# Patient Record
Sex: Male | Born: 1950 | Race: White | Hispanic: No | Marital: Married | Smoking: Never smoker
Health system: Southern US, Community
[De-identification: ages and names within clinical notes are randomized; demographics above are authoritative.]

## PROBLEM LIST (undated history)

## (undated) DIAGNOSIS — I219 Acute myocardial infarction, unspecified: Secondary | ICD-10-CM

## (undated) DIAGNOSIS — E785 Hyperlipidemia, unspecified: Secondary | ICD-10-CM

## (undated) DIAGNOSIS — I2510 Atherosclerotic heart disease of native coronary artery without angina pectoris: Secondary | ICD-10-CM

## (undated) DIAGNOSIS — K635 Polyp of colon: Secondary | ICD-10-CM

## (undated) DIAGNOSIS — I10 Essential (primary) hypertension: Secondary | ICD-10-CM

## (undated) HISTORY — PX: CORONARY ARTERY BYPASS GRAFT: SHX141

## (undated) HISTORY — PX: COLONOSCOPY: SHX174

---

## 2004-12-15 ENCOUNTER — Ambulatory Visit: Payer: Self-pay | Admitting: Gastroenterology

## 2010-01-10 ENCOUNTER — Ambulatory Visit: Payer: Self-pay | Admitting: Gastroenterology

## 2011-10-03 ENCOUNTER — Inpatient Hospital Stay: Payer: Self-pay | Admitting: Internal Medicine

## 2011-10-29 ENCOUNTER — Encounter: Payer: Self-pay | Admitting: Internal Medicine

## 2011-11-09 ENCOUNTER — Encounter: Payer: Self-pay | Admitting: Internal Medicine

## 2011-12-07 ENCOUNTER — Encounter: Payer: Self-pay | Admitting: Internal Medicine

## 2012-12-29 IMAGING — CR DG CHEST 2V
1 series · 2 of 2 positions shown · non-contrast
Comparison: none

REASON FOR EXAM: pain
COMMENTS:   May transport without cardiac monitor

[Series 1: pa · 0.17mm/px · 2 of 2 slices shown]
[im 1/2]
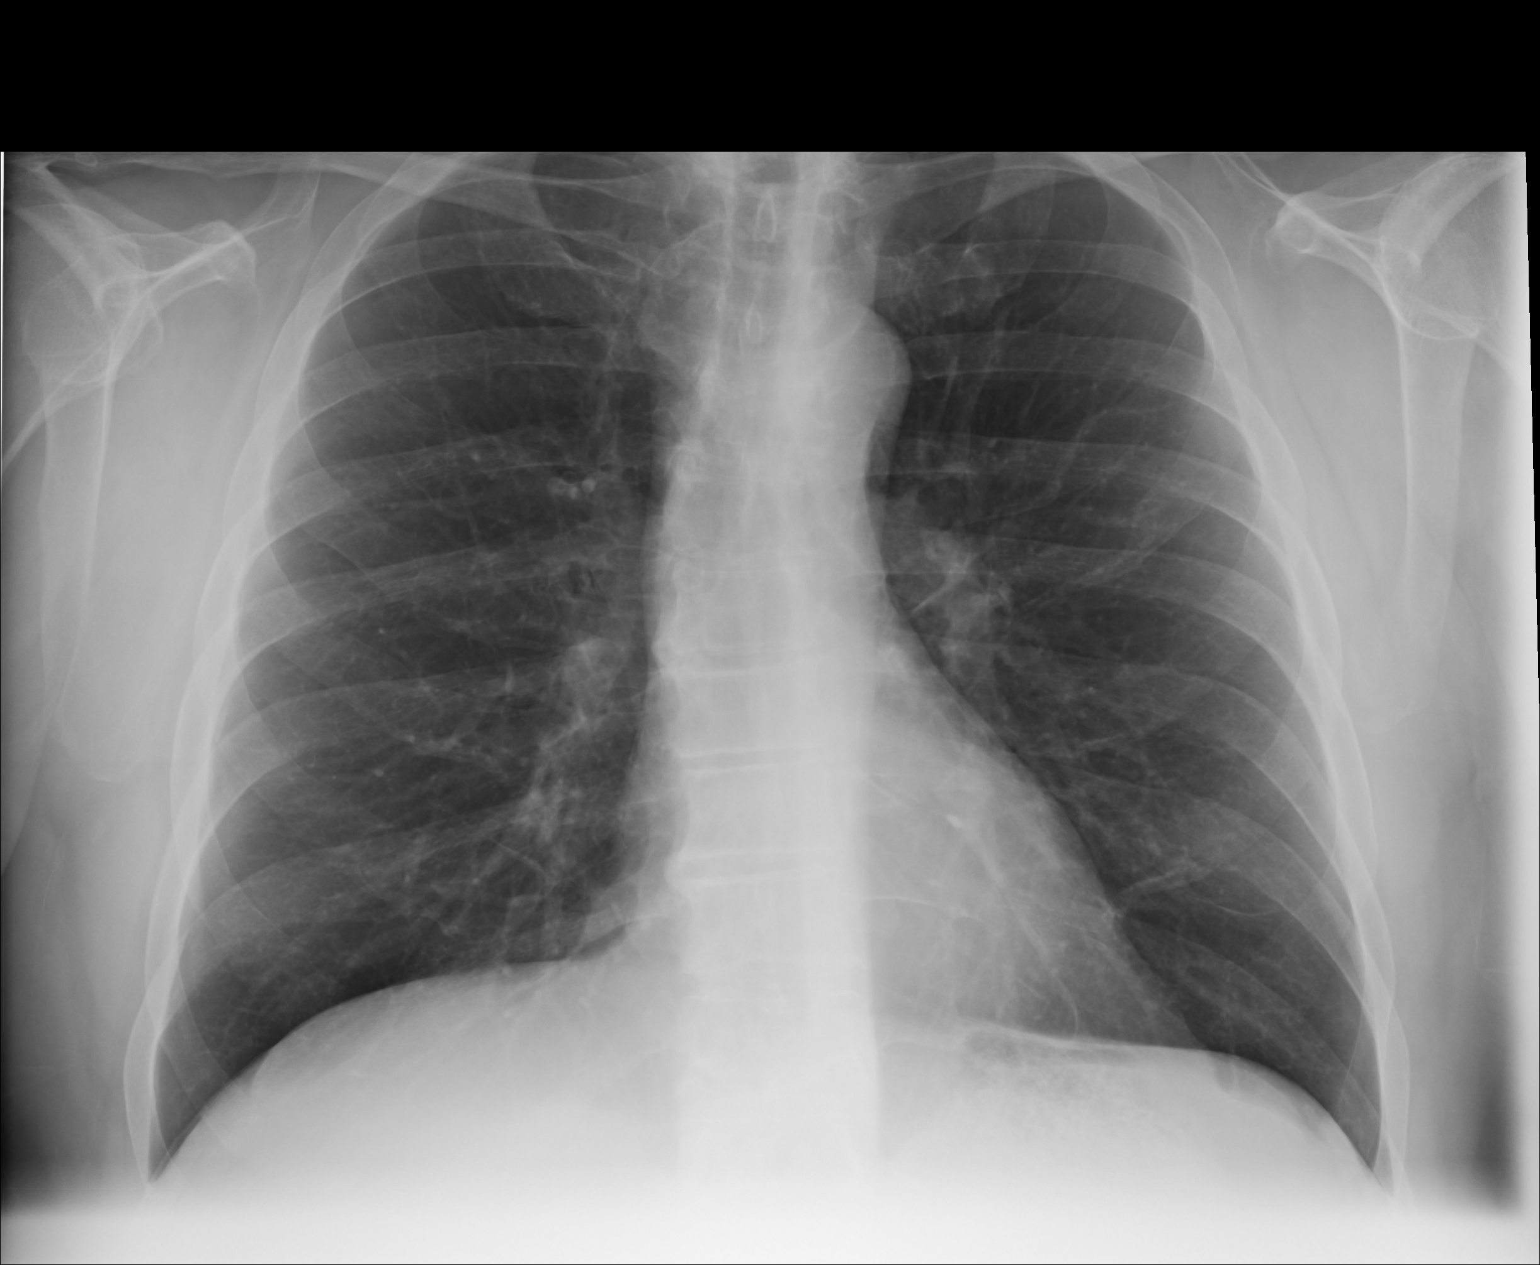
[im 2/2]
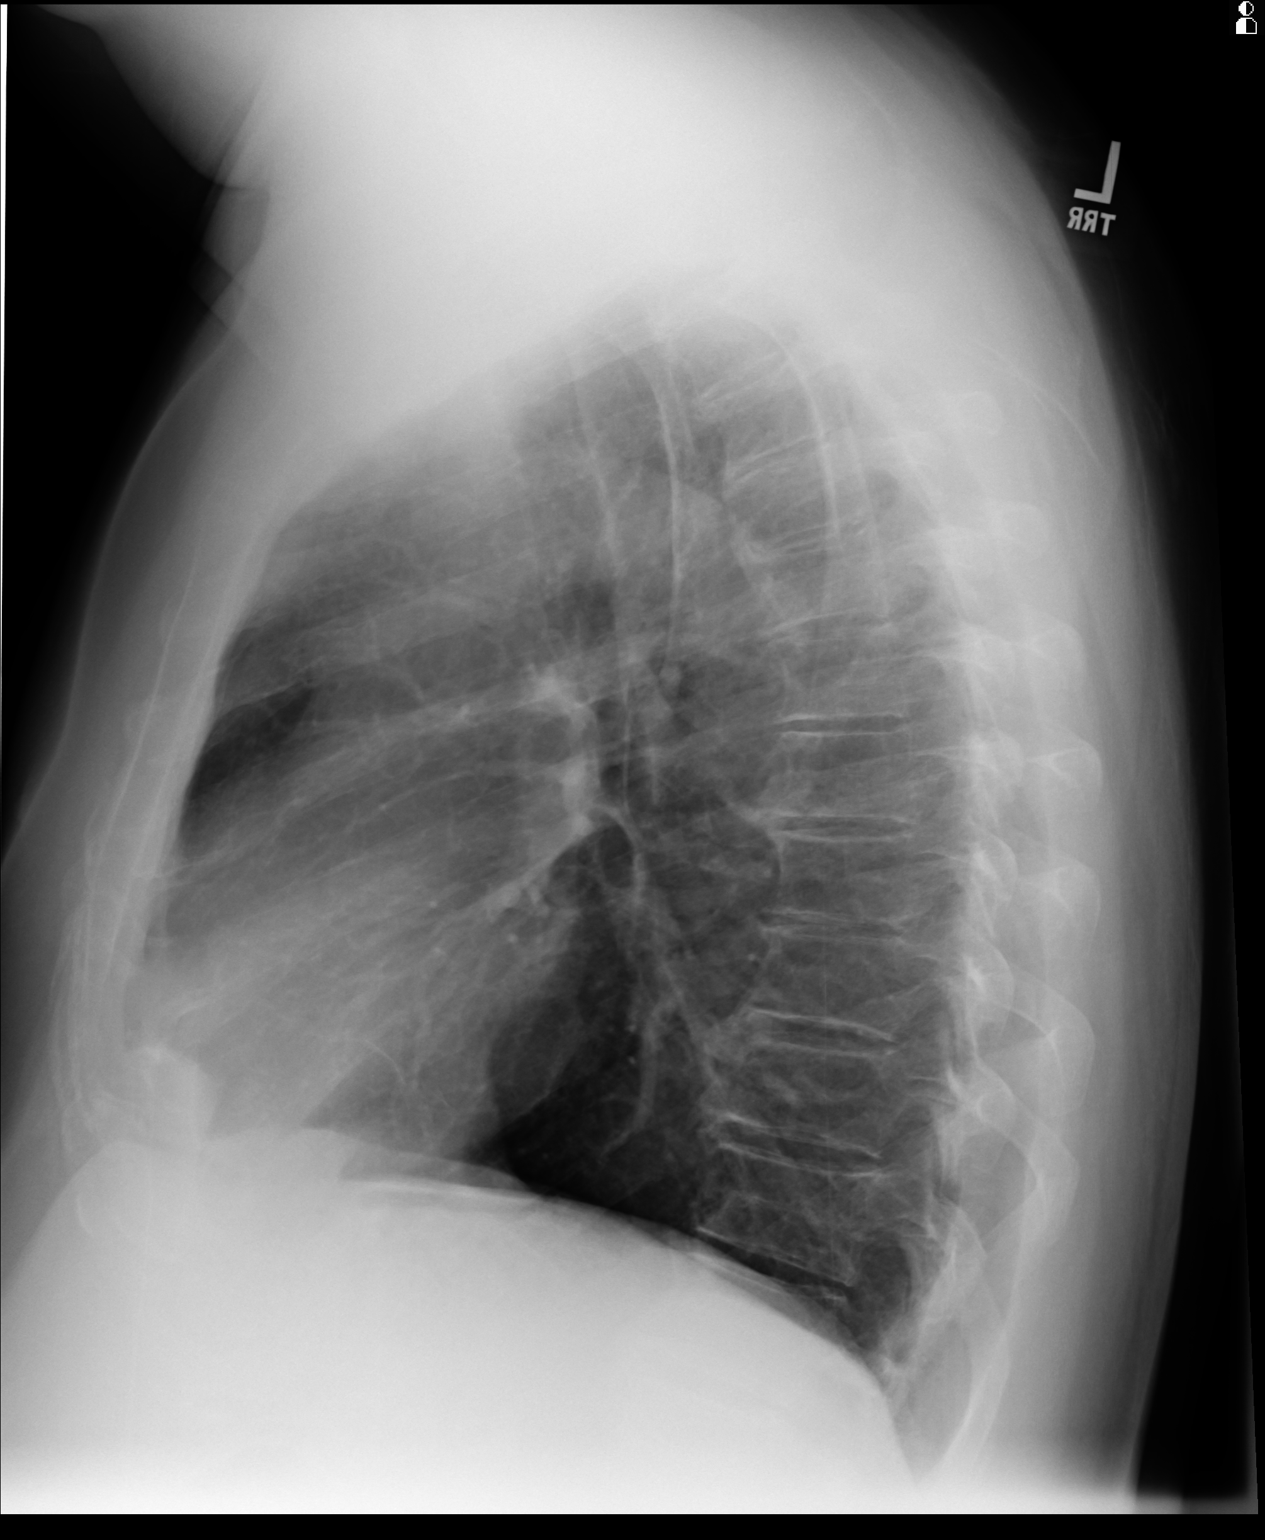

[2 of 2 positions shown; findings below may reference images not displayed]

PROCEDURE:     DXR - DXR CHEST PA (OR AP) AND LATERAL  - October 03, 2011  [DATE]

RESULT:     There is mild hyperinflation. The lungs are clear. The heart and
pulmonary vessels are normal. The bony and mediastinal structures are
unremarkable. There is no effusion. There is no pneumothorax or evidence of
congestive failure.
IMPRESSION: No acute cardiopulmonary disease.

## 2015-01-30 NOTE — Discharge Summary (Signed)
PATIENT NAME:  Kenneth Davenport, Kenneth Davenport MR#:  [id number] DATE OF BIRTH:  1951-09-23  DATE OF ADMISSION:  10/03/2011 DATE OF DISCHARGE:  10/04/2011  DISPOSITION: The patient is getting [facility] artery bypass graft.  DISCHARGE DIAGNOSES:  1. 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 The patient is getting transferred for coronary artery bypass graft.  2. Hyperlipidemia.  3. Tobacco abuse.   CONSULTANT: Arnoldo HookerBruce Kowalski, MD  PROCEDURES: Cardiac catheterization.   HOSPITAL COURSE: This is a 64 year old male who smokes cigars with history of hyperlipidemia. He presented with chest pain across the center of the chest radiating to his left arm improved with nitroglycerin in the emergency room. His EKG shows that he had some ST changes in the anterolateral leads, it was sinus rhythm. When he came his troponins were positive at 0.98 with a CK of 173 and MB of 9.6. He was admitted as acute non-ST elevation myocardial infarction. He was started on heparin drip, aspirin, beta blocker, nitroglycerin and statin. His creatinine was normal at 1.02. His peak troponins have gone up to 7.8 today. His lipid profile shows that his triglycerides are 236 and LDL is 77. His hemoglobin A1c is 6.5. He was taken to cardiac catheterization yesterday, on 10/03/2011, and it showed that the patient has significant three vessel disease with proximal LAD 60% stenosis, second lesion 80% stenosis, first diagonal 50% stenosis, OM-1 85% stenosis, OM-2 95% stenosis, proximal RCA 70% stenosis, second lesion 60% stenosis, and distal RCA 90% stenosis. The patient is chest pain free right now. His hemoglobin has been stable at 14.3 with a normal platelet of 208,000. His creatinine is stable at 0.95. He is currently on aspirin, Lovenox, metoprolol, and a statin. He also got IV hydration, peri-catheterization.      DISCHARGE MEDICATIONS: 1. Norco 5/325 mg 1 to 2 tabs every four hours as needed.  2. Aspirin 81 mg daily.  3. Lipitor 20 mg p.o. once daily. 4. Lovenox 105 mg subcutaneous twice a day. 5. Morphine 1 to 2 mg IV every four hours p.r.n.  6. Nitro-Dur 0.3 mg topical daily.  7. Zofran p.r.n.  8. Protonix 40 mg daily. 9. Ambien 10 mg at bedtime.   DISCHARGE CONDITION: His current vitals include a T-max around 99, heart rate 85, blood pressure 134/80, and saturating 95% on room air. His chest is clear. Heart sounds are regular.   DISPOSITION: The transfer process is initiated by Dr. Gwen PoundsKowalski, his cardiologist, and he is getting [facility] for coronary artery bypass graft.  ____________________________ Kenneth SorrowAbhinav Samuel Mcpeek, MD ag:slb D: 10/04/2011 16:29:23 ET T: 10/04/2011 16:41:43 ET JOB#: [id number]  cc: Kenneth SorrowAbhinav Yonas Bunda, MD, <Dictator> Jorje GuildGlenn R. Beckey DowningWillett, MD Kenneth SorrowABHINAV Consuelo Suthers MD ELECTRONICALLY SIGNED 10/26/2011 12:11

## 2015-01-30 NOTE — Consult Note (Signed)
PATIENT NAME:  Kenneth Davenport, Kenneth Davenport MR#:  [id number] DATE OF BIRTH:  June 29, 1951  DATE OF CONSULTATION:  10/03/2011  REFERRING PHYSICIAN:  Alounthith Phichith, MD CONSULTING PHYSICIAN:  Lamar BlinksBruce J. Alyzah Pelly, MD  REASON FOR CONSULTATION: Non ST elevation myocardial infarction.   CHIEF COMPLAINT: "I have chest pain."   HISTORY OF PRESENT ILLNESS: This is a 64 year old male with hypertension and hyperlipidemia having substernal chest discomfort intermittently over the last several weeks, worsening on the day of admission with radiation to his back and shortness of breath. The patient has had known appropriate treatment of hypertension and hyperlipidemia with current medical regimen. He has now an EKG showing normal sinus rhythm with inferior EKG changes consistent with infarct and possible myocardial ischemia. Troponin is 3. The patient has full improvements of symptoms and no longer has had any chest discomfort with appropriate medications including heparin.   REVIEW OF SYSTEMS:  The remainder of review of systems negative for vision change, ringing in ears, hearing loss, cough, congestion, heartburn, nausea, vomiting, diarrhea, bloody stool, stomach pain, extremity pain, leg weakness, cramping of the buttocks, known blood clots, headaches, blackouts, dizzy spells, nosebleeds, congestion, trouble swallowing, frequent urination, urination at night, muscle weakness, numbness, anxiety, depression, skin lesions, or skin rashes.   PAST MEDICAL HISTORY:  1. Hypertension.  2. Hyperlipidemia.   FAMILY HISTORY: Multiple family members with early onset of cardiovascular disease and hypertension.   SOCIAL HISTORY: He smokes 1 pack per day and occasionally drinks alcohol.   ALLERGIES: He has no known drug allergies.   CURRENT MEDICATIONS: As listed.   PHYSICAL EXAMINATION:  VITAL SIGNS: Blood pressure 136/68 bilaterally, heart 72 upright, reclining, and regular.   GENERAL: He is a well-appearing male in no  acute distress.   HEENT: No icterus, thyromegaly, ulcers, hemorrhage, or xanthelasma.   HEART: Regular rate and rhythm. Normal S1 and S2 without murmur, gallop, or rub. Point of maximal impulse is normal size and placement. Carotid upstroke normal without bruit. Jugular venous pressure is normal.   LUNGS: Lungs have few basilar crackles with normal respirations.   ABDOMEN: Soft, nontender, without hepatosplenomegaly or masses. Abdominal aorta is normal size without bruit.   EXTREMITIES: 2+ bilateral pulses in dorsal, pedal, radial, and femoral arteries without lower extremity edema, cyanosis, clubbing, or ulcers.   NEUROLOGIC: He is oriented to time, place, and person with normal mood and affect.   ASSESSMENT: 64 year old male with known cardiovascular disease risk factors including hypertension and hyperlipidemia having an abnormal EKG elevation of troponin and chest pain consistent with subendocardial myocardial infarction.   RECOMMENDATIONS:  1. Continue heparin, nitroglycerin, oxygen and beta blocker for further risk reduction. 2. Proceed to cardiac catheterization to assess coronary anatomy and further treatment thereof as necessary. The patient understands the risks and benefits of cardiac catheterization. These include the possibly of death, stroke, heart attack, infection, bleeding, or blood clot. He is at low risk for conscious sedation.   ____________________________ Lamar BlinksBruce J. Bobbye Reinitz, MD bjk:ap D: 10/04/2011 07:45:37 ET            T: 10/04/2011 10:16:48 ET                JOB#: [id number] cc: Lamar BlinksBruce J. Nike Southwell, MD, <Dictator> Lamar BlinksBRUCE J Marene Gilliam MD ELECTRONICALLY SIGNED 10/15/2011 9:46

## 2015-01-30 NOTE — H&P (Signed)
PATIENT NAME:  Kenneth Davenport, Kenneth Davenport MR#:  [id number] DATE OF BIRTH:  08-01-1951  DATE OF ADMISSION:  10/03/2011  REFERRING PHYSICIAN: Dr. Marilynne Halsted  PRIMARY CARE PHYSICIAN: Dr. Beckey Downing  PRESENTING COMPLAINT: Chest pain.   HISTORY OF PRESENT ILLNESS: Kenneth Davenport is a pleasant 64 year old gentleman with history of hyperlipidemia, arthritis who presents today with complaints of developing chest pain across the center of his chest while he was in bed. No associated nausea, vomiting, shortness of breath, diaphoresis, presyncope. He endorses palpitations. No radiation of pain. Reports that nitroglycerin has improved his symptoms but he is still currently feeling some pressure. No prior episodes in the past.   PAST MEDICAL HISTORY:  1. Hyperlipidemia.  2. Arthritis.   PAST SURGICAL HISTORY: None.   ALLERGIES: None.   MEDICATIONS:  1. Lipitor 10 mg daily.  2. Aleve as needed.   FAMILY HISTORY: Mother with bypass at the age of 32. Brother had coronary disease and stent the age of 60. Another brother at 40 with one vessel bypass. Paternal grandparents both died around 33 years old of myocardial infarction. Father had diabetes and colon cancer, is deceased from colon cancer. Paternal uncle had diabetes.   SOCIAL HISTORY: He lives in [place] with his wife. He smokes 1 to 2 cigars daily. Has 2 to 3 beers per day. No drug use.   REVIEW OF SYSTEMS: CONSTITUTIONAL: No fevers, chills. EYES: No inflammation, glaucoma, cataracts. ENT: No ear pain, hearing loss, epistaxis. RESPIRATORY: No cough, wheezing, hemoptysis. CARDIOVASCULAR: As per history of present illness. GASTROINTESTINAL: No nausea, vomiting, diarrhea, abdominal pain, hematemesis. GENITOURINARY: No dysuria, hematuria. ENDO: No polyuria, polydipsia. HEMATOLOGIC: No easy bleeding. SKIN: No ulcers. MUSCULOSKELETAL: He has occasional bright knee pain from injury 25 years ago. NEUROLOGIC: No one-sided weakness or numbness. PSYCH: Denies any depression or  suicidal ideation.   PHYSICAL EXAMINATION:  VITAL SIGNS: Temperature 98.2, pulse 93, respiratory rate 18, blood pressure 168/100, current blood pressure 158/73, sating at 97% on room air.   GENERAL: Lying in bed in no apparent distress.   HEENT: Normocephalic, atraumatic. Pupils equal, symmetric, nonicteric. Nares without discharge. Moist mucous membrane.   NECK: Soft and supple. No adenopathy or JVP.   CARDIOVASCULAR: Tachycardic. No murmurs, rubs, or gallops.   LUNGS: Clear to auscultation bilaterally. No use of accessory muscles or increased respiratory effort.   ABDOMEN: Soft, nontender, positive bowel sounds. No mass appreciated.   EXTREMITIES: No edema. Dorsal pedis pulses intact.   MUSCULOSKELETAL: No joint effusion.   SKIN: No ulcers.   NEUROLOGIC: No dysarthria or aphasia. Symmetrical strength. No focal deficits.   PSYCH: He is alert and oriented. Patient is cooperative.   LABORATORY, DIAGNOSTIC AND RADIOLOGICAL DATA: WBC 8.3, hemoglobin 17.0, hematocrit 50.1, platelet 214, MCV 85, glucose 184, BUN 16, creatinine 1.02, sodium 143, potassium 4.2, chloride 104, carbon dioxide 26, calcium 9.5, troponin 0.98, PTT 34.1. EKG with normal sinus rate of 95. No ST elevation. He has T wave in lead 3 and 2.   ASSESSMENT AND PLAN: Kenneth Davenport is a pleasant 64 year old gentleman with history of hyperlipidemia, tobacco use, arthritis presenting with chest pain.  1. Non-ST elevation myocardial infarction. Patient with risk factors including age and gender with significant family history, hyperlipidemia, tobacco use. Will start patient on aspirin. Continue heparin drip. Will start nitro patch. Increase Lipitor. Start beta blocker, oxygen, morphine as needed. Will send TSH, fasting lipid panel, A1c. He has hyperglycemia but no prior diagnosis of diabetes. Obtain cardiology consultation, would likely benefit from catheterization.  2. Hypertension with no prior diagnosis. As above initiate on beta  blocker.  3. Cigar use. Start on nicotine patch. Encouraged smoking cessation.  4. Daily alcohol use. No history of withdrawals. Continue to follow.  5. Prophylaxis with heparin drip, aspirin and Protonix.   TIME SPENT: Approximately 45 minutes spent on patient care.   ____________________________ Reuel DerbyAlounthith Deen Deguia, MD ap:cms D: 10/03/2011 03:35:44 ET T: 10/03/2011 09:09:31 ET JOB#: [id number]  cc: Maggi Hershkowitz, MD, <Dictator> Jorje GuildGlenn R. Beckey DowningWillett, MD Reuel DerbyALOUNTHITH Ludell Zacarias MD ELECTRONICALLY SIGNED 10/27/2011 2:20

## 2020-03-15 ENCOUNTER — Other Ambulatory Visit
Admission: RE | Admit: 2020-03-15 | Discharge: 2020-03-15 | Disposition: A | Discharge status: Home / Self Care | Payer: Medicare Other | Source: Ambulatory Visit | Attending: Internal Medicine | Admitting: Internal Medicine

## 2020-03-15 ENCOUNTER — Other Ambulatory Visit: Payer: Self-pay

## 2020-03-15 DIAGNOSIS — Z01812 Encounter for preprocedural laboratory examination: Secondary | ICD-10-CM | POA: Insufficient documentation

## 2020-03-15 DIAGNOSIS — Z20822 Contact with and (suspected) exposure to covid-19: Secondary | ICD-10-CM | POA: Insufficient documentation

## 2020-03-16 ENCOUNTER — Encounter: Payer: Self-pay | Admitting: Internal Medicine

## 2020-03-16 LAB — SARS CORONAVIRUS 2 (TAT 6-24 HRS): SARS Coronavirus 2: NEGATIVE

## 2020-03-17 ENCOUNTER — Ambulatory Visit
Admission: RE | Admit: 2020-03-17 | Discharge: 2020-03-17 | Disposition: A | Discharge status: Home / Self Care | Payer: Medicare Other | Attending: Internal Medicine | Admitting: Internal Medicine

## 2020-03-17 ENCOUNTER — Encounter: Payer: Self-pay | Admitting: Internal Medicine

## 2020-03-17 ENCOUNTER — Ambulatory Visit: Payer: Medicare Other | Admitting: Certified Registered Nurse Anesthetist

## 2020-03-17 ENCOUNTER — Encounter
Admission: RE | Disposition: A | Discharge status: Home / Self Care | Payer: Self-pay | Source: Home / Self Care | Attending: Internal Medicine

## 2020-03-17 DIAGNOSIS — E785 Hyperlipidemia, unspecified: Secondary | ICD-10-CM | POA: Diagnosis not present

## 2020-03-17 DIAGNOSIS — Z7982 Long term (current) use of aspirin: Secondary | ICD-10-CM | POA: Insufficient documentation

## 2020-03-17 DIAGNOSIS — Z1211 Encounter for screening for malignant neoplasm of colon: Secondary | ICD-10-CM | POA: Insufficient documentation

## 2020-03-17 DIAGNOSIS — Z951 Presence of aortocoronary bypass graft: Secondary | ICD-10-CM | POA: Diagnosis not present

## 2020-03-17 DIAGNOSIS — Z79899 Other long term (current) drug therapy: Secondary | ICD-10-CM | POA: Diagnosis not present

## 2020-03-17 DIAGNOSIS — Z800 Family history of malignant neoplasm of digestive organs: Secondary | ICD-10-CM | POA: Diagnosis not present

## 2020-03-17 DIAGNOSIS — I10 Essential (primary) hypertension: Secondary | ICD-10-CM | POA: Insufficient documentation

## 2020-03-17 DIAGNOSIS — I252 Old myocardial infarction: Secondary | ICD-10-CM | POA: Insufficient documentation

## 2020-03-17 DIAGNOSIS — I2510 Atherosclerotic heart disease of native coronary artery without angina pectoris: Secondary | ICD-10-CM | POA: Diagnosis not present

## 2020-03-17 HISTORY — DX: Atherosclerotic heart disease of native coronary artery without angina pectoris: I25.10

## 2020-03-17 HISTORY — PX: COLONOSCOPY WITH PROPOFOL: SHX5780

## 2020-03-17 HISTORY — DX: Polyp of colon: K63.5

## 2020-03-17 HISTORY — DX: Essential (primary) hypertension: I10

## 2020-03-17 HISTORY — DX: Acute myocardial infarction, unspecified: I21.9

## 2020-03-17 HISTORY — DX: Hyperlipidemia, unspecified: E78.5

## 2020-03-17 SURGERY — COLONOSCOPY WITH PROPOFOL
Anesthesia: General

## 2020-03-17 MED ORDER — PROPOFOL 10 MG/ML IV BOLUS
INTRAVENOUS | Status: DC | PRN
  Administered 2020-03-17: 40 mg via INTRAVENOUS
  Administered 2020-03-17: 60 mg via INTRAVENOUS

## 2020-03-17 MED ORDER — SODIUM CHLORIDE 0.9 % IV SOLN: INTRAVENOUS | Status: DC

## 2020-03-17 MED ORDER — PHENYLEPHRINE HCL (PRESSORS) 10 MG/ML IV SOLN
INTRAVENOUS | Status: AC
  Filled 2020-03-17: qty 1

## 2020-03-17 MED ORDER — SODIUM CHLORIDE 0.9 % IV SOLN
INTRAVENOUS | Status: DC
  Administered 2020-03-17: 1000 mL via INTRAVENOUS

## 2020-03-17 MED ORDER — LIDOCAINE HCL (PF) 2 % IJ SOLN
INTRAMUSCULAR | Status: AC
  Filled 2020-03-17: qty 10

## 2020-03-17 MED ORDER — LIDOCAINE HCL (CARDIAC) PF 100 MG/5ML IV SOSY
PREFILLED_SYRINGE | INTRAVENOUS | Status: DC | PRN
  Administered 2020-03-17: 50 mg via INTRAVENOUS

## 2020-03-17 MED ORDER — PROPOFOL 500 MG/50ML IV EMUL
INTRAVENOUS | Status: AC
  Filled 2020-03-17: qty 50

## 2020-03-17 MED ORDER — PROPOFOL 500 MG/50ML IV EMUL
INTRAVENOUS | Status: DC | PRN
  Administered 2020-03-17: 150 ug/kg/min via INTRAVENOUS

## 2020-03-17 NOTE — Op Note (Signed)
[facility] Gastroenterology Patient Name: Kenneth Davenport Procedure Date: 03/17/2020 9:43 AM MRN: [id number] Account #: [id number] Date of Birth: [birth year] Admit Type: Outpatient Age: 69 Room: [facility] ENDO ROOM 4 Gender: Male Note Status: Finalized Procedure:             Colonoscopy Indications:           Screening patient at increased risk: Family history of                         1st-degree relative with colorectal cancer at age 69                         years (or older) Providers:             Tyronne Blann K. Norma Fredrickson MD, MD Referring MD:          Marisue Ivan (Referring MD) Medicines:             Propofol per Anesthesia Complications:         No immediate complications. Procedure:             Pre-Anesthesia Assessment:                        - The risks and benefits of the procedure and the                         sedation options and risks were discussed with the                         patient. All questions were answered and informed                         consent was obtained.                        - Patient identification and proposed procedure were                         verified prior to the procedure by the nurse. The                         procedure was verified in the procedure room.                        - ASA Grade Assessment: III - A patient with severe                         systemic disease.                        - After reviewing the risks and benefits, the patient                         was deemed in satisfactory condition to undergo the                         procedure.                        After obtaining informed consent, the colonoscope  was                         passed under direct vision. Throughout the procedure,                         the patient's blood pressure, pulse, and oxygen                         saturations were monitored continuously. The                         Colonoscope was introduced through the anus and                          advanced to the the cecum, identified by appendiceal                         orifice and ileocecal valve. The colonoscopy was                         performed without difficulty. The patient tolerated                         the procedure well. The quality of the bowel                         preparation was excellent. The ileocecal valve,                         appendiceal orifice, and rectum were photographed. Findings:      The perianal and digital rectal examinations were normal. Pertinent       negatives include normal sphincter tone and no palpable rectal lesions.      The entire examined colon appeared normal on direct and retroflexion       views. Impression:            - The entire examined colon is normal on direct and                         retroflexion views.                        - No specimens collected. Recommendation:        - Patient has a contact number available for                         emergencies. The signs and symptoms of potential                         delayed complications were discussed with the patient.                         Return to normal activities tomorrow. Written                         discharge instructions were provided to the patient.                        -  Resume previous diet.                        - Continue present medications.                        - Repeat colonoscopy in 5 years for screening purposes.                        - Return to GI office PRN.                        - The findings and recommendations were discussed with                         the patient. Procedure Code(s):     --- Professional ---                        C5885, Colorectal cancer screening; colonoscopy on                         individual at high risk Diagnosis Code(s):     --- Professional ---                        Z80.0, Family history of malignant neoplasm of                         digestive organs CPT copyright 2019 American  Medical Association. All rights reserved. The codes documented in this report are preliminary and upon coder review may  be revised to meet current compliance requirements. Stanton Kidney MD, MD 03/17/2020 10:31:24 AM This report has been signed electronically. Number of Addenda: 0 Note Initiated On: 03/17/2020 9:43 AM Scope Withdrawal Time: 0 hours 6 minutes 35 seconds  Total Procedure Duration: 0 hours 13 minutes 5 seconds  Estimated Blood Loss:  Estimated blood loss: none.      [facility]

## 2020-03-17 NOTE — Anesthesia Preprocedure Evaluation (Signed)
Anesthesia Evaluation  Patient identified by MRN, date of birth, ID band Patient awake    Reviewed: Allergy & Precautions, H&P , NPO status , Patient's Chart, lab work & pertinent test results  History of Anesthesia Complications Negative for: history of anesthetic complications  Airway Mallampati: III  TM Distance: <3 FB Neck ROM: limited    Dental  (+) Chipped   Pulmonary neg pulmonary ROS, neg shortness of breath,    Pulmonary exam normal        Cardiovascular Exercise Tolerance: Good hypertension, (-) angina+ CAD, + Past MI and + CABG  (-) DOE Normal cardiovascular exam     Neuro/Psych negative neurological ROS  negative psych ROS   GI/Hepatic negative GI ROS, Neg liver ROS, neg GERD  ,  Endo/Other  negative endocrine ROS  Renal/GU negative Renal ROS  negative genitourinary   Musculoskeletal   Abdominal   Peds  Hematology negative hematology ROS (+)   Anesthesia Other Findings Past Medical History: No date: Colon polyps No date: Coronary artery disease No date: Hyperlipidemia No date: Hypertension No date: Myocardial infarction [facility])  Past Surgical History: No date: COLONOSCOPY No date: CORONARY ARTERY BYPASS GRAFT  BMI    Body Mass Index: 24.51 kg/m      Reproductive/Obstetrics negative OB ROS                             Anesthesia Physical Anesthesia Plan  ASA: III  Anesthesia Plan: General   Post-op Pain Management:    Induction: Intravenous  PONV Risk Score and Plan: Propofol infusion and TIVA  Airway Management Planned: Natural Airway and Nasal Cannula  Additional Equipment:   Intra-op Plan:   Post-operative Plan:   Informed Consent: I have reviewed the patients History and Physical, chart, labs and discussed the procedure including the risks, benefits and alternatives for the proposed anesthesia with the patient or authorized representative who has  indicated his/her understanding and acceptance.     Dental Advisory Given  Plan Discussed with: Anesthesiologist, CRNA and Surgeon  Anesthesia Plan Comments: (Patient consented for risks of anesthesia including but not limited to:  - adverse reactions to medications - risk of intubation if required - damage to eyes, teeth, lips or other oral mucosa - nerve damage due to positioning  - sore throat or hoarseness - Damage to heart, brain, nerves, lungs, other parts of body or loss of life  Patient voiced understanding.)        Anesthesia Quick Evaluation

## 2020-03-17 NOTE — H&P (Signed)
Outpatient short stay form Pre-procedure 03/17/2020 9:51 AM Kenneth Davenport K. Norma Fredrickson, M.D.  Primary Physician: Marisue Ivan, M.D.  Reason for visit:  Family history of colon cancer  History of present illness:  69 year old patient presenting for family history of colon cancer. Patient denies any change in bowel habits, rectal bleeding or involuntary weight loss.     Current Facility-Administered Medications:  .  0.9 %  sodium chloride infusion, , Intravenous, Continuous, [place], Boykin Nearing, MD, Last Rate: 20 mL/hr at 03/17/20 0901, Continued from Pre-op at 03/17/20 0901  Medications Prior to Admission  Medication Sig Dispense Refill Last Dose  . aspirin EC 81 MG tablet Take 81 mg by mouth daily.     Marland Kitchen atorvastatin (LIPITOR) 40 MG tablet Take 40 mg by mouth daily.   03/16/2020 at Unknown time  . B Complex-C (SUPER B COMPLEX PO) Take by mouth daily.     Marland Kitchen CINNAMON PO Take by mouth 2 (two) times daily.     . fish oil-omega-3 fatty acids 1000 MG capsule Take 2 g by mouth 2 (two) times daily.     Marland Kitchen lisinopril (ZESTRIL) 10 MG tablet Take 10 mg by mouth daily.   03/17/2020 at Unknown time     No Known Allergies   Past Medical History:  Diagnosis Date  . Colon polyps   . Coronary artery disease   . Hyperlipidemia   . Hypertension   . Myocardial infarction [facility])     Review of systems:  Otherwise negative.    Physical Exam  Gen: Alert, oriented. Appears stated age.  HEENT: [place]/AT. PERRLA. Lungs: CTA, no wheezes. CV: RR nl S1, S2. Abd: soft, benign, no masses. BS+ Ext: No edema. Pulses 2+    Planned procedures: Proceed with colonoscopy. The patient understands the nature of the planned procedure, indications, risks, alternatives and potential complications including but not limited to bleeding, infection, perforation, damage to internal organs and possible oversedation/side effects from anesthesia. The patient agrees and gives consent to proceed.  Please refer to procedure notes  for findings, recommendations and patient disposition/instructions.     Kenneth Davenport K. Norma Fredrickson, M.D. Gastroenterology 03/17/2020  9:51 AM

## 2020-03-17 NOTE — Anesthesia Postprocedure Evaluation (Signed)
Anesthesia Post Note  Patient: Kenneth Davenport  Procedure(s) Performed: COLONOSCOPY WITH PROPOFOL (N/A )  Patient location during evaluation: Endoscopy Anesthesia Type: General Level of consciousness: awake and alert Pain management: pain level controlled Vital Signs Assessment: post-procedure vital signs reviewed and stable Respiratory status: spontaneous breathing, nonlabored ventilation, respiratory function stable and patient connected to nasal cannula oxygen Cardiovascular status: blood pressure returned to baseline and stable Postop Assessment: no apparent nausea or vomiting Anesthetic complications: no   No complications documented.   Last Vitals:  Vitals:   03/17/20 1056 03/17/20 1100  BP:    Pulse: (!) 56 (!) 58  Resp: 11 16  Temp:    SpO2: 100% 100%    Last Pain:  Vitals:   03/17/20 1030  TempSrc: Tympanic  PainSc:                  Cleda Mccreedy Tyniya Kuyper

## 2020-03-17 NOTE — Transfer of Care (Signed)
Immediate Anesthesia Transfer of Care Note  Patient: Kenneth Davenport  Procedure(s) Performed: COLONOSCOPY WITH PROPOFOL (N/A )  Patient Location: PACU  Anesthesia Type:General  Level of Consciousness: drowsy  Airway & Oxygen Therapy: Patient Spontanous Breathing  Post-op Assessment: Report given to RN and Post -op Vital signs reviewed and stable  Post vital signs: Reviewed and stable  Last Vitals:  Vitals Value Taken Time  BP 93/65 03/17/20 1031  Temp    Pulse 63 03/17/20 1031  Resp 13 03/17/20 1031  SpO2 99 % 03/17/20 1031  Vitals shown include unvalidated device data.  Last Pain:  Vitals:   03/17/20 0835  TempSrc: Tympanic  PainSc: 0-No pain         Complications: No complications documented.

## 2020-03-17 NOTE — Interval H&P Note (Signed)
History and Physical Interval Note:  03/17/2020 9:52 AM  Kenneth Davenport  has presented today for surgery, with the diagnosis of FAMILY PERSONAL HX.OF COLON CANCER.  The various methods of treatment have been discussed with the patient and family. After consideration of risks, benefits and other options for treatment, the patient has consented to  Procedure(s): COLONOSCOPY WITH PROPOFOL (N/A) as a surgical intervention.  The patient's history has been reviewed, patient examined, no change in status, stable for surgery.  I have reviewed the patient's chart and labs.  Questions were answered to the patient's satisfaction.     [place], [place]

## 2020-03-18 ENCOUNTER — Encounter: Payer: Self-pay | Admitting: Internal Medicine

## 2023-05-23 ENCOUNTER — Emergency Department
Admission: EM | Admit: 2023-05-23 | Discharge: 2023-05-23 | Disposition: A | Discharge status: Home / Self Care | Payer: Medicare Other | Attending: Emergency Medicine | Admitting: Emergency Medicine

## 2023-05-23 ENCOUNTER — Emergency Department: Payer: Medicare Other

## 2023-05-23 ENCOUNTER — Other Ambulatory Visit: Payer: Self-pay

## 2023-05-23 DIAGNOSIS — S01112A Laceration without foreign body of left eyelid and periocular area, initial encounter: Secondary | ICD-10-CM | POA: Insufficient documentation

## 2023-05-23 DIAGNOSIS — S0993XA Unspecified injury of face, initial encounter: Secondary | ICD-10-CM | POA: Diagnosis present

## 2023-05-23 DIAGNOSIS — S0182XA Laceration with foreign body of other part of head, initial encounter: Secondary | ICD-10-CM | POA: Insufficient documentation

## 2023-05-23 DIAGNOSIS — S60511A Abrasion of right hand, initial encounter: Secondary | ICD-10-CM | POA: Insufficient documentation

## 2023-05-23 DIAGNOSIS — S065XAA Traumatic subdural hemorrhage with loss of consciousness status unknown, initial encounter: Secondary | ICD-10-CM

## 2023-05-23 DIAGNOSIS — Z23 Encounter for immunization: Secondary | ICD-10-CM | POA: Insufficient documentation

## 2023-05-23 DIAGNOSIS — S065X0A Traumatic subdural hemorrhage without loss of consciousness, initial encounter: Secondary | ICD-10-CM | POA: Insufficient documentation

## 2023-05-23 DIAGNOSIS — Y9301 Activity, walking, marching and hiking: Secondary | ICD-10-CM | POA: Insufficient documentation

## 2023-05-23 DIAGNOSIS — W01198A Fall on same level from slipping, tripping and stumbling with subsequent striking against other object, initial encounter: Secondary | ICD-10-CM | POA: Insufficient documentation

## 2023-05-23 DIAGNOSIS — T07XXXA Unspecified multiple injuries, initial encounter: Secondary | ICD-10-CM

## 2023-05-23 DIAGNOSIS — Y9259 Other trade areas as the place of occurrence of the external cause: Secondary | ICD-10-CM | POA: Insufficient documentation

## 2023-05-23 DIAGNOSIS — W19XXXA Unspecified fall, initial encounter: Secondary | ICD-10-CM

## 2023-05-23 DIAGNOSIS — S0181XA Laceration without foreign body of other part of head, initial encounter: Secondary | ICD-10-CM

## 2023-05-23 MED ORDER — LIDOCAINE HCL (PF) 1 % IJ SOLN
5.0000 mL | Freq: Once | INTRAMUSCULAR | Status: AC
  Administered 2023-05-23: 5 mL
  Filled 2023-05-23: qty 5

## 2023-05-23 MED ORDER — LIDOCAINE HCL (PF) 1 % IJ SOLN
INTRAMUSCULAR | Status: AC
  Administered 2023-05-23: 5 mL
  Filled 2023-05-23: qty 5

## 2023-05-23 MED ORDER — TETANUS-DIPHTH-ACELL PERTUSSIS 5-2.5-18.5 LF-MCG/0.5 IM SUSY
0.5000 mL | PREFILLED_SYRINGE | Freq: Once | INTRAMUSCULAR | Status: AC
  Administered 2023-05-23: 0.5 mL via INTRAMUSCULAR
  Filled 2023-05-23: qty 0.5

## 2023-05-23 MED ORDER — LIDOCAINE-EPINEPHRINE-TETRACAINE (LET) TOPICAL GEL
3.0000 mL | Freq: Once | TOPICAL | Status: AC
  Administered 2023-05-23: 3 mL via TOPICAL
  Filled 2023-05-23: qty 3

## 2023-05-23 NOTE — ED Notes (Signed)
Dressing placed on left eye.

## 2023-05-23 NOTE — ED Notes (Signed)
See triage note  Presents s/p fall  [place]  [place]ed a step  Larey Seat face first   Hitting the left side of his face  Abrasions and laceration noted  No LOC

## 2023-05-23 NOTE — ED Triage Notes (Signed)
Pt to ED via [place] mall. Pt reports he was walking and stepped off the curb wrong and fell striking the left side of face. Abrasions and swelling noted to left eye, under left eye and right hand. Pt denies LOC. Pt denies blood thinners. Pt A&Ox4.

## 2023-05-23 NOTE — Discharge Instructions (Signed)
Follow-up with your primary care provider if any continued problems and for suture removal in approximately 7 to 10 days.  You may also go to an urgent care or return to the emergency department for suture removal.  Clean with mild soap and water and allowed to dry completely before covering this.  At times when you are at home watching TV leave these uncovered to allow them to get plenty of air.  For any signs of infection.  You may take Tylenol if needed for headache.  Do not take anti-inflammatories.  Follow-up with Dr. Myer Haff if any continued headache.  Have your wife read the information about head injuries and have her bring you to the emergency department immediately should she notice any change in your behavior, speech or any projectile vomiting or worsening of your symptoms. Discontinue your aspirin 81 mg for the next several days and then restart.

## 2023-05-23 NOTE — ED Provider Notes (Signed)
[facility] Provider Note    Event Date/Time   First MD Initiated Contact with Patient 05/23/23 [id number]     (approximate)   History   Fall   HPI  Kenneth Davenport is a 72 y.o. male presents to the ED by wife after a fall that occurred this morning while he was walking around [place] the mall.  Patient states that he tripped on the curb and fell striking the left side of his face.  He reports that his sunglasses did not break but does have a laceration to the left side of his face.  He denies any visual changes, nausea, vomiting or headache.  Patient has some superficial abrasions to his right hand.  He is continue to ambulate since his accident.  Patient denies any blood thinners but does take a aspirin 81 mg on a daily basis.  Wife is present and states that he did not lose consciousness and has remained normal behavior.     Physical Exam   Triage Vital Signs: ED Triage Vitals  Encounter Vitals Group     BP 05/23/23 0857 133/79     Systolic BP Percentile --      Diastolic BP Percentile --      Pulse Rate 05/23/23 0857 81     Resp 05/23/23 0857 18     Temp 05/23/23 0857 98.1 F (36.7 C)     Temp Source 05/23/23 0857 Oral     SpO2 05/23/23 0857 100 %     Weight --      Height --      Head Circumference --      Peak Flow --      Pain Score 05/23/23 0855 3     Pain Loc --      Pain Education --      Exclude from Growth Chart --     Most recent vital signs: Vitals:   05/23/23 0857 05/23/23 1258  BP: 133/79 130/79  Pulse: 81 78  Resp: 18 18  Temp: 98.1 F (36.7 C) 98 F (36.7 C)  SpO2: 100% 100%     General: Awake, no distress.  Alert, talkative, answers questions appropriately and is oriented x 3. CV:  Good peripheral perfusion.  Heart rate and rate and rhythm. Resp:  Normal effort.  Lungs are clear bilaterally. Abd:  No distention.  Other:  Able move upper and lower extremities without any difficulty.  PERRLA, EOMI's, cranial nerves  II through XII grossly intact, laceration noted to the left eyebrow and left cheek area without active bleeding.  Nontender cervical spine to palpation posteriorly.  Patient is able to move upper and lower extremities without any difficulty.  There are some very superficial abrasions noted to his right palm but patient is able to move digits without any difficulty, no soft tissue edema and range of motion is without restriction.  No point tenderness on palpation of the lower extremities, nontender pelvis on compression, no shortening or rotation of the lower extremities noted.  Nontender thoracic or lumbar spine.   ED Results / Procedures / Treatments   Labs (all labs ordered are listed, but only abnormal results are displayed) Labs Reviewed - No data to display    RADIOLOGY CT head without contrast per radiologist shows possible 2 mm interhemispheric subdural per telephone conversation. CT maxillofacial and cervical spine are negative for acute fractures  Repeat CT scan 6 hours after initial CT shows an increase from 2 mm  interhemispheric subdural to a 4 mm interhemispheric subdural.    PROCEDURES:  Critical Care performed:   Marland KitchenMarland KitchenLaceration Repair  Date/Time: 05/23/2023 11:20 PM  Performed by: Tommi Rumps, PA-C Authorized by: Tommi Rumps, PA-C   Consent:    Consent obtained:  Verbal   Consent given by:  Patient   Risks discussed:  Pain, infection and poor wound healing Universal protocol:    Patient identity confirmed:  Verbally with patient Anesthesia:    Anesthesia method:  Topical application and local infiltration   Topical anesthetic:  LET   Local anesthetic:  Lidocaine 1% w/o epi Laceration details:    Location:  Face   Face location:  L eyebrow   Length (cm):  4 Pre-procedure details:    Preparation:  Patient was prepped and draped in usual sterile fashion and imaging obtained to evaluate for foreign bodies Exploration:    Limited defect created (wound  extended): no     Hemostasis achieved with:  Direct pressure and LET   Imaging outcome: foreign body not noted     Contaminated: no   Treatment:    Area cleansed with:  Saline   Amount of cleaning:  Extensive   Irrigation solution:  Sterile saline   Irrigation method:  Syringe and tap   Visualized foreign bodies/material removed: no     Debridement:  None   Layers/structures repaired:  Vernona Rieger:    Suture size:  5-0   Suture material:  Vicryl   Suture technique:  Buried horizontal mattress   Number of sutures:  3 Skin repair:    Repair method:  Sutures   Suture size:  6-0   Suture material:  Prolene   Suture technique:  Simple interrupted   Number of sutures:  9 Approximation:    Approximation:  Close Repair type:    Repair type:  Intermediate Post-procedure details:    Dressing:  Non-adherent dressing   Procedure completion:  Tolerated .Marland KitchenLaceration Repair  Date/Time: 05/23/2023 12:27 PM  Performed by: Tommi Rumps, PA-C Authorized by: Tommi Rumps, PA-C   Consent:    Consent obtained:  Verbal   Risks discussed:  Infection, pain and poor cosmetic result Anesthesia:    Anesthesia method:  Local infiltration   Local anesthetic:  Lidocaine 1% w/o epi Laceration details:    Location:  Face   Face location:  L cheek   Length (cm):  2 Pre-procedure details:    Preparation:  Patient was prepped and draped in usual sterile fashion and imaging obtained to evaluate for foreign bodies Exploration:    Limited defect created (wound extended): no     Hemostasis achieved with:  Direct pressure   Contaminated: no   Treatment:    Area cleansed with:  Saline   Amount of cleaning:  Standard   Irrigation solution:  Sterile saline   Irrigation method:  Tap   Visualized foreign bodies/material removed: no   Skin repair:    Repair method:  Sutures   Suture size:  6-0   Suture material:  Prolene   Suture technique:  Simple interrupted   Number of sutures:   3 Approximation:    Approximation:  Close Repair type:    Repair type:  Simple Post-procedure details:    Dressing:  Non-adherent dressing   Procedure completion:  Tolerated    MEDICATIONS ORDERED IN ED: Medications  Tdap (BOOSTRIX) injection 0.5 mL (0.5 mLs Intramuscular Given 05/23/23 1033)  lidocaine-EPINEPHrine-tetracaine (LET) topical gel (3 mLs Topical Given 05/23/23  1032)  lidocaine (PF) (XYLOCAINE) 1 % injection 5 mL (5 mLs Infiltration Given 05/23/23 1147)     IMPRESSION / MDM / ASSESSMENT AND PLAN / ED COURSE  I reviewed the triage vital signs and the nursing notes.   Differential diagnosis includes, but is not limited to, head injury, subdural, laceration, foreign body, facial fracture, cervical fracture.  ----------------------------------------- 12:33 PM on 05/23/2023 ----------------------------------------- Consulted Dr. Marcell Barlow who is on-call for neurosurgery who reviewed patient information and suggested repeat CT in 6 hours to see if there is any enlargement of the 2 mm intrahemispheric subdural.  ----------------------------------------- 4:11 PM on 05/23/2023 ----------------------------------------- Confirmed with Dr. Myer Haff after second CT scan.  Patient continues to be talkative, no neurological symptoms, anxious to get something to eat and drink.  Has been up ambulating without any assistance without difficulty.  Dr. Myer Haff states that patient is okay to discharge home if nonsymptomatic.  72 year old male presents to the ED after a fall that occurred this morning while walking around the mall and tripped due to the curb on the outside of the mall.  No loss of consciousness per wife.  Laceration was repaired.  After initial head CT findings were discussed with patient and wife who is aware that he will be in the emergency department for a second CT.  Patient remained talkative, hungry, ambulatory.  Patient was reassured with second CT and also consulting  Dr. Myer Haff.  We discussed discontinuing his aspirin 81 mg for the next several days and Tylenol if needed for headache.  Wife is aware that if there is any sudden changes to his behavior, worsening of his symptoms, or urgent concerns she is return to the emergency department with patient for reevaluation.  Also discussed suture removal and approximately 7 to 10 days from his left eyebrow and cheek area.  Instructions for wound care were given.  All questions for wife and patient were answered.  He has to follow-up with his PCP and Dr. Marcell Barlow if any continued headache or problems.     Patient's presentation is most consistent with acute presentation with potential threat to life or bodily function.  FINAL CLINICAL IMPRESSION(S) / ED DIAGNOSES   Final diagnoses:  Subdural hematoma (HCC)  Laceration of multiple sites of face  Abrasions of multiple sites  Fall, initial encounter     Rx / DC Orders   ED Discharge Orders     None        Note:  This document was prepared using Dragon voice recognition software and may include unintentional dictation errors.   Tommi Rumps, PA-C 05/23/23 1619    Minna Antis, MD 05/24/23 [phone]

## 2024-07-17 ENCOUNTER — Other Ambulatory Visit: Payer: Self-pay | Admitting: Nurse Practitioner

## 2024-07-17 DIAGNOSIS — I350 Nonrheumatic aortic (valve) stenosis: Secondary | ICD-10-CM

## 2024-07-29 ENCOUNTER — Telehealth (HOSPITAL_COMMUNITY): Payer: Self-pay | Admitting: Emergency Medicine

## 2024-07-29 NOTE — Telephone Encounter (Signed)
 Reaching out to patient to offer assistance regarding upcoming cardiac imaging study; pt verbalizes understanding of appt date/time, parking situation and where to check in, pre-test NPO status and medications ordered, and verified current allergies; name and call back number provided for further questions should they arise Rockwell Alexandria RN Navigator Cardiac Imaging [facility] [phone] office [phone] cell

## 2024-07-30 ENCOUNTER — Ambulatory Visit
Admission: RE | Admit: 2024-07-30 | Discharge: 2024-07-30 | Disposition: A | Discharge status: Home / Self Care | Source: Ambulatory Visit | Attending: Nurse Practitioner | Admitting: Nurse Practitioner

## 2024-07-30 DIAGNOSIS — I350 Nonrheumatic aortic (valve) stenosis: Secondary | ICD-10-CM | POA: Diagnosis present

## 2024-07-30 MED ORDER — DILTIAZEM HCL 25 MG/5ML IV SOLN: 10.0000 mg | INTRAVENOUS | Status: DC | PRN

## 2024-07-30 MED ORDER — METOPROLOL TARTRATE 5 MG/5ML IV SOLN: 10.0000 mg | INTRAVENOUS | Status: DC | PRN

## 2024-07-30 MED ORDER — IOHEXOL 350 MG/ML SOLN
100.0000 mL | Freq: Once | INTRAVENOUS | Status: AC | PRN
  Administered 2024-07-30: 100 mL via INTRAVENOUS

## 2024-09-22 ENCOUNTER — Emergency Department
Admission: EM | Admit: 2024-09-22 | Discharge: 2024-09-22 | Disposition: A | Discharge status: Home / Self Care | Attending: Emergency Medicine | Admitting: Emergency Medicine

## 2024-09-22 ENCOUNTER — Other Ambulatory Visit: Payer: Self-pay

## 2024-09-22 DIAGNOSIS — I10 Essential (primary) hypertension: Secondary | ICD-10-CM | POA: Diagnosis not present

## 2024-09-22 DIAGNOSIS — R030 Elevated blood-pressure reading, without diagnosis of hypertension: Secondary | ICD-10-CM | POA: Diagnosis present

## 2024-09-22 LAB — CBC
HCT: 47.8 % (ref 39.0–52.0)
Hemoglobin: 15.5 g/dL (ref 13.0–17.0)
MCH: 28.3 pg (ref 26.0–34.0)
MCHC: 32.4 g/dL (ref 30.0–36.0)
MCV: 87.4 fL (ref 80.0–100.0)
Platelets: 183 K/uL (ref 150–400)
RBC: 5.47 MIL/uL (ref 4.22–5.81)
RDW: 12.5 % (ref 11.5–15.5)
WBC: 6.5 K/uL (ref 4.0–10.5)
nRBC: 0 % (ref 0.0–0.2)

## 2024-09-22 LAB — COMPREHENSIVE METABOLIC PANEL WITH GFR
ALT: 36 U/L (ref 0–44)
AST: 32 U/L (ref 15–41)
Albumin: 4.5 g/dL (ref 3.5–5.0)
Alkaline Phosphatase: 82 U/L (ref 38–126)
Anion gap: 10 (ref 5–15)
BUN: 15 mg/dL (ref 8–23)
CO2: 26 mmol/L (ref 22–32)
Calcium: 9.8 mg/dL (ref 8.9–10.3)
Chloride: 102 mmol/L (ref 98–111)
Creatinine, Ser: 0.94 mg/dL (ref 0.61–1.24)
GFR, Estimated: >60 mL/min (ref >60)
Glucose, Bld: 240 mg/dL — ABNORMAL HIGH (ref 70–99)
Potassium: 4.5 mmol/L (ref 3.5–5.1)
Sodium: 139 mmol/L (ref 135–145)
Total Bilirubin: 1.8 mg/dL — ABNORMAL HIGH (ref 0.0–1.2)
Total Protein: 6.8 g/dL (ref 6.5–8.1)

## 2024-09-22 LAB — TROPONIN T, HIGH SENSITIVITY: Troponin T High Sensitivity: 15 ng/L (ref 0–19)

## 2024-09-22 NOTE — Discharge Instructions (Signed)
 Given seen in the emergency department today for high blood pressure and pulse rate.  Your workup today shows reassuring results.  Please follow-up with your doctor regarding today's ER visit.  Return to the emergency department for any chest pain, trouble breathing, or any other symptom personally concerning to yourself.

## 2024-09-22 NOTE — ED Triage Notes (Signed)
 C/O heart rate being elevated this morning, 80's -- usually runs in the 60's.  BP 135/79, also higher than normal.  Denies chest pain, states feels a little light headed.  AAOx3. Skin warm and dry. NAD

## 2024-09-22 NOTE — ED Notes (Signed)
 CCMD called to initiate cardiac monitoring.

## 2024-09-22 NOTE — ED Provider Notes (Signed)
 [facility] Provider Note    Event Date/Time   First MD Initiated Contact with Patient 09/22/24 [id number]     (approximate)  History   Chief Complaint: Dizziness  HPI  Kenneth Davenport is a 73 y.o. male with a past medical history of hypertension, hyperlipidemia, prior MI, presents to the emergency department for elevated heart rate and blood pressure.  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  He states every morning he checks his vital signs, he states this morning his heart rate was elevated around 80 bpm and his blood pressure around 130-140.  He states normally his heart rate is in the 60s and his blood pressure is usually closer to 110.  Patient states given the abnormal vitals and his history of a heart attack previously he came to the emergency department for an evaluation.  Patient denies any chest pain no shortness of breath.  Denies any recent illnesses.  No fever cough congestion.  No nausea vomiting or diarrhea.  Patient appears well.  Patient admits that he became very nervous/anxious regarding his vital signs but wanted to get checked out.  Physical Exam   Triage Vital Signs: ED Triage Vitals  Encounter Vitals Group     BP 09/22/24 0820 (!) 141/83     Girls Systolic BP Percentile --      Girls Diastolic BP Percentile --      Boys Systolic BP Percentile --      Boys Diastolic BP Percentile --      Pulse Rate 09/22/24 0820 90     Resp 09/22/24 0820 16     Temp 09/22/24 0820 98.4 F (36.9 C)     Temp Source 09/22/24 0820 Oral     SpO2 09/22/24 0820 99 %     Weight 09/22/24 0819 166 lb 0.1 oz (75.3 kg)     Height --      Head Circumference --      Peak Flow --      Pain Score 09/22/24 0819 0     Pain Loc --      Pain Education --      Exclude from Growth Chart --     Most recent vital signs: Vitals:   09/22/24 0820  BP: (!) 141/83  Pulse: 90  Resp: 16  Temp: 98.4 F (36.9 C)  SpO2:  99%    General: Awake, no distress.  CV:  Good peripheral perfusion.  Regular rate and rhythm  Resp:  Normal effort.  Equal breath sounds bilaterally.  Abd:  No distention.  Soft, nontender.  No rebound or guarding.  ED Results / Procedures / Treatments   EKG  EKG viewed and interpreted by myself shows a sinus rhythm at 90 bpm with a narrow QRS, normal axis slight PR prolongation otherwise normal intervals occasional PVC.  MEDICATIONS ORDERED IN ED: Medications - No data to display   IMPRESSION / MDM / ASSESSMENT AND PLAN / ED COURSE  I reviewed the triage vital signs and the nursing notes.  Patient's presentation is most consistent with acute presentation with potential threat to life or bodily function.  Patient presents emergency department for elevated heart rate elevated blood pressure.  Patient checks his vital signs routinely every morning.  Denies any symptoms or complaints.  Given the patient's history we will check labs including a CBC chemistry and cardiac enzyme.  EKG reassuring although with occasional PVC.  Overall  patient appears very well.  Patient's lab work is reassuring with a normal CBC normal chemistry besides mild hyperglycemia.  Normal creatinine.  Normal troponin.  EKG is reassuring.  Given the patient's reassuring workup I believe the patient safe for discharge home with outpatient follow-up.  Patient agreeable to plan of care.  FINAL CLINICAL IMPRESSION(S) / ED DIAGNOSES   Medical evaluation   Note:  This document was prepared using Dragon voice recognition software and may include unintentional dictation errors.   Dorothyann Drivers, MD 09/22/24 [phone]
